# Patient Record
Sex: Male | Born: 1980 | Race: White | Hispanic: No | Smoking: Former smoker
Health system: Southern US, Community
[De-identification: ages and names within clinical notes are randomized; demographics above are authoritative.]

## PROBLEM LIST (undated history)

## (undated) DIAGNOSIS — K219 Gastro-esophageal reflux disease without esophagitis: Secondary | ICD-10-CM

## (undated) DIAGNOSIS — G4730 Sleep apnea, unspecified: Secondary | ICD-10-CM

## (undated) DIAGNOSIS — I7774 Dissection of vertebral artery: Secondary | ICD-10-CM

## (undated) HISTORY — PX: CEREBRAL ANGIOGRAM: SHX1326

## (undated) HISTORY — PX: OTHER SURGICAL HISTORY: SHX169

---
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
## 2004-11-30 ENCOUNTER — Emergency Department (HOSPITAL_COMMUNITY): Admission: EM | Admit: 2004-11-30 | Discharge: 2004-11-30 | Payer: Self-pay | Admitting: *Deleted

## 2006-03-05 ENCOUNTER — Ambulatory Visit: Payer: Self-pay | Admitting: Pulmonary Disease

## 2007-07-25 ENCOUNTER — Emergency Department (HOSPITAL_COMMUNITY): Admission: EM | Admit: 2007-07-25 | Discharge: 2007-07-25 | Payer: Self-pay | Admitting: Emergency Medicine

## 2009-10-12 DIAGNOSIS — I7774 Dissection of vertebral artery: Secondary | ICD-10-CM

## 2009-10-12 HISTORY — DX: Dissection of vertebral artery: I77.74

## 2011-03-23 ENCOUNTER — Other Ambulatory Visit (HOSPITAL_COMMUNITY): Payer: Self-pay | Admitting: Family Medicine

## 2011-03-23 DIAGNOSIS — I771 Stricture of artery: Secondary | ICD-10-CM

## 2011-03-26 ENCOUNTER — Other Ambulatory Visit (HOSPITAL_COMMUNITY): Payer: Self-pay | Admitting: Interventional Radiology

## 2011-03-26 ENCOUNTER — Ambulatory Visit (HOSPITAL_COMMUNITY)
Admission: RE | Admit: 2011-03-26 | Discharge: 2011-03-26 | Disposition: A | Discharge status: Home / Self Care | Payer: BC Managed Care – PPO | Source: Ambulatory Visit | Attending: Family Medicine | Admitting: Family Medicine

## 2011-03-26 DIAGNOSIS — N320 Bladder-neck obstruction: Secondary | ICD-10-CM

## 2011-03-26 DIAGNOSIS — I771 Stricture of artery: Secondary | ICD-10-CM

## 2011-03-27 ENCOUNTER — Other Ambulatory Visit (HOSPITAL_COMMUNITY): Payer: Self-pay | Admitting: Interventional Radiology

## 2011-03-27 ENCOUNTER — Ambulatory Visit (HOSPITAL_COMMUNITY)
Admission: RE | Admit: 2011-03-27 | Discharge: 2011-03-27 | Disposition: A | Discharge status: Home / Self Care | Payer: BC Managed Care – PPO | Source: Ambulatory Visit | Attending: Interventional Radiology | Admitting: Interventional Radiology

## 2011-03-27 DIAGNOSIS — R9409 Abnormal results of other function studies of central nervous system: Secondary | ICD-10-CM | POA: Insufficient documentation

## 2011-03-27 DIAGNOSIS — Z01812 Encounter for preprocedural laboratory examination: Secondary | ICD-10-CM | POA: Insufficient documentation

## 2011-03-27 DIAGNOSIS — N320 Bladder-neck obstruction: Secondary | ICD-10-CM

## 2011-03-27 DIAGNOSIS — R51 Headache: Secondary | ICD-10-CM | POA: Insufficient documentation

## 2011-03-27 LAB — POCT I-STAT, CHEM 8
BUN: 23 mg/dL (ref 6–23)
Chloride: 102 mEq/L (ref 96–112)
Creatinine, Ser: 1.2 mg/dL (ref 0.50–1.35)
Hemoglobin: 14.3 g/dL (ref 13.0–17.0)
TCO2: 28 mmol/L (ref 0–100)

## 2011-03-27 LAB — CBC
HCT: 40.1 % (ref 39.0–52.0)
MCV: 83.4 fL (ref 78.0–100.0)
RBC: 4.81 MIL/uL (ref 4.22–5.81)

## 2011-03-27 LAB — PROTIME-INR
INR: 1.02 (ref 0.00–1.49)
Prothrombin Time: 13.6 seconds (ref 11.6–15.2)

## 2011-03-27 MED ORDER — IOHEXOL 300 MG/ML  SOLN
150.0000 mL | Freq: Once | INTRAMUSCULAR | Status: AC | PRN
  Administered 2011-03-27: 80 mL via INTRAVENOUS

## 2011-03-30 ENCOUNTER — Other Ambulatory Visit (HOSPITAL_COMMUNITY): Payer: Self-pay | Admitting: Interventional Radiology

## 2011-03-30 DIAGNOSIS — I670 Dissection of cerebral arteries, nonruptured: Secondary | ICD-10-CM

## 2011-05-11 ENCOUNTER — Ambulatory Visit (HOSPITAL_COMMUNITY)
Admission: RE | Admit: 2011-05-11 | Discharge: 2011-05-11 | Disposition: A | Discharge status: Home / Self Care | Payer: BC Managed Care – PPO | Source: Ambulatory Visit | Attending: Interventional Radiology | Admitting: Interventional Radiology

## 2011-05-11 DIAGNOSIS — I670 Dissection of cerebral arteries, nonruptured: Secondary | ICD-10-CM

## 2012-06-26 ENCOUNTER — Emergency Department (HOSPITAL_COMMUNITY): Payer: BC Managed Care – PPO

## 2012-06-26 ENCOUNTER — Encounter (HOSPITAL_COMMUNITY): Payer: Self-pay | Admitting: Emergency Medicine

## 2012-06-26 ENCOUNTER — Emergency Department (HOSPITAL_COMMUNITY)
Admission: EM | Admit: 2012-06-26 | Discharge: 2012-06-26 | Disposition: A | Discharge status: Home / Self Care | Payer: BC Managed Care – PPO | Attending: Emergency Medicine | Admitting: Emergency Medicine

## 2012-06-26 DIAGNOSIS — M542 Cervicalgia: Secondary | ICD-10-CM | POA: Insufficient documentation

## 2012-06-26 DIAGNOSIS — Y92410 Unspecified street and highway as the place of occurrence of the external cause: Secondary | ICD-10-CM | POA: Insufficient documentation

## 2012-06-26 DIAGNOSIS — F1010 Alcohol abuse, uncomplicated: Secondary | ICD-10-CM | POA: Insufficient documentation

## 2012-06-26 DIAGNOSIS — R51 Headache: Secondary | ICD-10-CM | POA: Insufficient documentation

## 2012-06-26 DIAGNOSIS — R079 Chest pain, unspecified: Secondary | ICD-10-CM | POA: Insufficient documentation

## 2012-06-26 DIAGNOSIS — F10929 Alcohol use, unspecified with intoxication, unspecified: Secondary | ICD-10-CM

## 2012-06-26 DIAGNOSIS — R52 Pain, unspecified: Secondary | ICD-10-CM | POA: Insufficient documentation

## 2012-06-26 LAB — POCT I-STAT, CHEM 8
BUN: 6 mg/dL (ref 6–23)
Calcium, Ion: 1.11 mmol/L — ABNORMAL LOW (ref 1.12–1.23)
Chloride: 107 mEq/L (ref 96–112)
Potassium: 3.6 mEq/L (ref 3.5–5.1)
Sodium: 146 mEq/L — ABNORMAL HIGH (ref 135–145)

## 2012-06-26 LAB — CBC WITH DIFFERENTIAL/PLATELET
Basophils Absolute: 0 10*3/uL (ref 0.0–0.1)
Basophils Relative: 0 % (ref 0–1)
Eosinophils Absolute: 0.1 10*3/uL (ref 0.0–0.7)
Eosinophils Relative: 1 % (ref 0–5)
Hemoglobin: 14.5 g/dL (ref 13.0–17.0)
MCH: 31.9 pg (ref 26.0–34.0)
MCV: 89.5 fL (ref 78.0–100.0)
Monocytes Absolute: 0.3 10*3/uL (ref 0.1–1.0)
RBC: 4.55 MIL/uL (ref 4.22–5.81)

## 2012-06-26 LAB — ETHANOL: Alcohol, Ethyl (B): 255 mg/dL — ABNORMAL HIGH (ref 0–11)

## 2012-06-26 MED ORDER — SODIUM CHLORIDE 0.9 % IV BOLUS (SEPSIS)
1000.0000 mL | Freq: Once | INTRAVENOUS | Status: AC
  Administered 2012-06-26: 1000 mL via INTRAVENOUS

## 2012-06-26 MED ORDER — FENTANYL CITRATE 0.05 MG/ML IJ SOLN
50.0000 ug | Freq: Once | INTRAMUSCULAR | Status: AC
  Administered 2012-06-26: 50 ug via INTRAVENOUS
  Filled 2012-06-26: qty 2

## 2012-06-26 MED ORDER — ONDANSETRON HCL 4 MG/2ML IJ SOLN
4.0000 mg | Freq: Once | INTRAMUSCULAR | Status: AC
  Administered 2012-06-26: 4 mg via INTRAVENOUS
  Filled 2012-06-26: qty 2

## 2012-06-26 MED ORDER — ACETAMINOPHEN 325 MG PO TABS: 325.0000 mg | ORAL_TABLET | Freq: Four times a day (QID) | ORAL | Status: AC | PRN

## 2012-06-26 MED ORDER — LORAZEPAM 2 MG/ML IJ SOLN
INTRAMUSCULAR | Status: AC
  Administered 2012-06-26: 2 mg
  Filled 2012-06-26: qty 1

## 2012-06-26 MED ORDER — IOHEXOL 300 MG/ML  SOLN
100.0000 mL | Freq: Once | INTRAMUSCULAR | Status: AC | PRN
  Administered 2012-06-26: 100 mL via INTRAVENOUS

## 2012-06-26 NOTE — Progress Notes (Signed)
Orthopedic Tech Progress Note Patient Details:  Carlos Schwartz 02/23/1981 [id number]  Patient ID: Carlos Schwartz, male   DOB: [birth year], 31 y.o.   MRN: [id number]  Made trauma visit Nikki Dom 06/26/2012, 7:45 PM
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
## 2012-06-26 NOTE — ED Notes (Signed)
Error in charting.Level 2 activated 1922hrs

## 2012-06-26 NOTE — ED Notes (Signed)
Pt gave permission to chaplin Selinda Michaels to call his ex wife Crystal [phone].
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
## 2012-06-26 NOTE — Progress Notes (Deleted)
At patients request I called his ex-wife Chrystal. Also I had a brief conversation with him.  Selinda Michaels [phone]
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
## 2012-06-27 NOTE — Progress Notes (Signed)
Chaplain went into room and met patient, who was obviously in distress.  Brother and mother were present and trying to offer spiritual encouragement to patient.  A few minutes later, I endountered mother in hallway outside the room.  We talked for a few moments and I offered spiritual encouragement.  She asked for payers for her son, although she did not believe he would welcome them in person.  I assured her that I would pray with him. Further spiritual encouragement for mother, then RN came to talk with them and mother went back into the room  Rev. Audie Box [phone]

## 2012-06-27 NOTE — ED Provider Notes (Signed)
I saw and evaluated the patient, reviewed the resident's note and I agree with the findings and plan.  Deniah Saia T Shari Natt, MD 06/27/12 0925 

## 2012-12-18 ENCOUNTER — Ambulatory Visit (HOSPITAL_BASED_OUTPATIENT_CLINIC_OR_DEPARTMENT_OTHER): Payer: BC Managed Care – PPO
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
## 2014-10-16 ENCOUNTER — Ambulatory Visit (HOSPITAL_BASED_OUTPATIENT_CLINIC_OR_DEPARTMENT_OTHER): Payer: No Typology Code available for payment source | Attending: Family Medicine | Admitting: Radiology

## 2014-10-16 VITALS — Ht 74.0 in | Wt 230.0 lb

## 2014-10-16 DIAGNOSIS — G4733 Obstructive sleep apnea (adult) (pediatric): Secondary | ICD-10-CM | POA: Insufficient documentation

## 2014-10-16 DIAGNOSIS — G4710 Hypersomnia, unspecified: Secondary | ICD-10-CM | POA: Diagnosis present

## 2014-10-16 DIAGNOSIS — R0683 Snoring: Secondary | ICD-10-CM | POA: Diagnosis not present

## 2014-10-20 DIAGNOSIS — R0683 Snoring: Secondary | ICD-10-CM

## 2014-10-20 NOTE — Sleep Study (Signed)
   NAME: Carlos DoorMichael J Garrow DATE OF BIRTH:  08/19/1981 MEDICAL RECORD NUMBER [id number]  LOCATION: [facility]  PHYSICIAN: Nazaiah Navarrete D  DATE OF STUDY: 10/16/2014  SLEEP STUDY TYPE: Nocturnal Polysomnogram               REFERRING PHYSICIAN: Hollace KinnierKlein, Carol, MD  INDICATION FOR STUDY: Hypersomnia with sleep apnea  EPWORTH SLEEPINESS SCORE:   15/24 HEIGHT: 6\' 2"  (188 cm)  WEIGHT: 104.327 kg (230 lb)    Body mass index is 29.52 kg/(m^2).  NECK SIZE: 17.5 in.  MEDICATIONS: Charted for review  SLEEP ARCHITECTURE: Total sleep time 381.5 minutes with sleep efficiency 93.6%. Stage I was 11.5%, stage II 64.9%, stage III absent, REM 23.6% of total sleep time. Sleep latency 11 minutes, REM latency 144.5 minutes, awake after sleep onset 15 minutes, arousal index 21.1, bedtime medication: Ambien  RESPIRATORY DATA: Apnea hypopnea index (AHI) 37.1 per hour. 236 total events scored including 54 obstructive apneas, 4 central apneas, 1 mixed apnea, 177 hypopneas. Most events were while supine. REM AHI 7.3 per hour. This study was ordered as a diagnostic polysomnogram without CPAP.  OXYGEN DATA: Snoring was very loud while supine, moderate while on sides with oxygen desaturation to a nadir of 83% and mean saturation 94% on room air.  CARDIAC DATA: Sinus rhythm with PACs  MOVEMENT/PARASOMNIA: No significant movement disorder, no bathroom trips  IMPRESSION/ RECOMMENDATION:   1) Severe obstructive sleep apnea/hypopnea syndrome, AHI 37.1 per hour with mostly supine events. REM AHI 7.3 per hour. Snoring was very loud while supine, moderate when he was on his sides, with oxygen desaturation to a nadir of 83% and mean saturation 94% on room air. 2) Treatment for scores in this range usually starts with CPAP. If appropriate this patient can be scheduled for CPAP titration study through the sleep disorder center.   Carlos BudgeYOUNG,Carlos Schwartz D Diplomate, American Board of Sleep  Medicine  ELECTRONICALLY SIGNED ON:  10/20/2014, 2:38 PM [facility] PH: [phone]   FX: [phone] ACCREDITED BY THE AMERICAN ACADEMY OF SLEEP MEDICINE

## 2014-12-16 ENCOUNTER — Ambulatory Visit (HOSPITAL_BASED_OUTPATIENT_CLINIC_OR_DEPARTMENT_OTHER): Payer: No Typology Code available for payment source | Attending: Family Medicine

## 2014-12-16 VITALS — Ht 74.0 in | Wt 235.0 lb

## 2014-12-16 DIAGNOSIS — G4733 Obstructive sleep apnea (adult) (pediatric): Secondary | ICD-10-CM | POA: Insufficient documentation

## 2014-12-19 ENCOUNTER — Encounter (HOSPITAL_BASED_OUTPATIENT_CLINIC_OR_DEPARTMENT_OTHER): Payer: BC Managed Care – PPO

## 2014-12-22 DIAGNOSIS — G4733 Obstructive sleep apnea (adult) (pediatric): Secondary | ICD-10-CM

## 2014-12-22 NOTE — Sleep Study (Signed)
   NAME: Carlos Schwartz DATE OF BIRTH:  10/21/1980 MEDICAL RECORD NUMBER [id number]  LOCATION: [facility]  PHYSICIAN: YOUNG,CLINTON D  DATE OF STUDY: 12/16/2014  SLEEP STUDY TYPE: Nocturnal Polysomnogram               REFERRING PHYSICIAN: Donaciano EvaWoodruff, Sarah, FNP  INDICATION FOR STUDY: Hypersomnia with sleep apnea-CPAP titration  EPWORTH SLEEPINESS SCORE:   13/24 HEIGHT: 6\' 2"  (188 cm)  WEIGHT: 235 lb (106.595 kg)    Body mass index is 30.16 kg/(m^2).  NECK SIZE: 17.5 in.  MEDICATIONS: Charted for review  SLEEP ARCHITECTURE: Total sleep time 350 minutes with sleep efficiency 94.2%. Stage I was 4%, stage II 71.7%, stage III absent, REM 24.3% of total sleep time. Sleep latency 0 minutes, REM latency 62 minutes, awake after sleep onset 21.5 minutes, arousal index 6.0, bedtime medication: Ambien 10 mg, alprazolam 1 mg  RESPIRATORY DATA: CPAP titration protocol. CPAP was titrated to 10 CWP, AHI 0 per hour. He wore a medium fullface mask.  OXYGEN DATA: Snoring was prevented by CPAP with mean oxygen saturation 94.3% on room air  CARDIAC DATA: Normal sinus rhythm  MOVEMENT/PARASOMNIA: 405 limb jerks were counted of which only 8 were associated with arousal or awakening for a periodic limb movement with arousal index of 1.4 per hour. No bathroom trips.  IMPRESSION/ RECOMMENDATION:   1) Successful CPAP titration to 10 CWP, AHI 0 per hour. He wore a medium F&P Simplus fullface mask with heated humidifier and an EPR of 1  2) Baseline polysomnogram on 10/16/2014 recorded AHI 37.1 per hour with body weight 230 pounds. 3) Very frequent limb jerks were noted on the current study, but with little associated sleep disturbance noted. If limb jerks sleep disorder problems persist in the home environment then specific intervention such as Requip or Mirapex might be considered. The patient had taken Ambien 10 mg plus alprazolam 1 mg at 2230 PM on the current study  night.  Waymon BudgeYOUNG,CLINTON D Diplomate, American Board of Sleep Medicine  ELECTRONICALLY SIGNED ON:  12/22/2014, 9:59 AM [facility] PH: [phone]   FX: [phone] ACCREDITED BY THE AMERICAN ACADEMY OF SLEEP MEDICINE

## 2015-09-29 ENCOUNTER — Emergency Department (HOSPITAL_COMMUNITY)
Admission: EM | Admit: 2015-09-29 | Discharge: 2015-09-29 | Disposition: A | Discharge status: Home / Self Care | Payer: No Typology Code available for payment source | Attending: Emergency Medicine | Admitting: Emergency Medicine

## 2015-09-29 ENCOUNTER — Encounter (HOSPITAL_COMMUNITY): Payer: Self-pay | Admitting: Emergency Medicine

## 2015-09-29 DIAGNOSIS — K002 Abnormalities of size and form of teeth: Secondary | ICD-10-CM | POA: Insufficient documentation

## 2015-09-29 DIAGNOSIS — Z79899 Other long term (current) drug therapy: Secondary | ICD-10-CM | POA: Insufficient documentation

## 2015-09-29 DIAGNOSIS — K029 Dental caries, unspecified: Secondary | ICD-10-CM | POA: Insufficient documentation

## 2015-09-29 DIAGNOSIS — Z87891 Personal history of nicotine dependence: Secondary | ICD-10-CM | POA: Insufficient documentation

## 2015-09-29 DIAGNOSIS — K047 Periapical abscess without sinus: Secondary | ICD-10-CM | POA: Insufficient documentation

## 2015-09-29 MED ORDER — IBUPROFEN 600 MG PO TABS: 600.0000 mg | ORAL_TABLET | Freq: Three times a day (TID) | ORAL | Status: DC | PRN

## 2015-09-29 MED ORDER — HYDROCODONE-ACETAMINOPHEN 5-325 MG PO TABS: 1.0000 | ORAL_TABLET | ORAL | Status: DC | PRN

## 2015-09-29 MED ORDER — PENICILLIN V POTASSIUM 500 MG PO TABS
500.0000 mg | ORAL_TABLET | Freq: Four times a day (QID) | ORAL | Status: DC
Start: 2015-09-29 — End: 2016-12-14

## 2015-09-29 MED ORDER — IBUPROFEN 800 MG PO TABS
800.0000 mg | ORAL_TABLET | Freq: Once | ORAL | Status: AC
  Administered 2015-09-29: 800 mg via ORAL
  Filled 2015-09-29: qty 1

## 2015-09-29 NOTE — ED Provider Notes (Signed)
CSN: [id number]     Arrival date & time 09/29/15  1130 History   First MD Initiated Contact with Patient 09/29/15 1144     Chief Complaint  Patient presents with  . Dental Pain    pain in l/upper mouth x 1 day     (Consider location/radiation/quality/duration/timing/severity/associated sxs/prior Treatment) HPI   Patient presents with pain in his left lower molar with associated facial swelling.  States he has "bad teeth" and felt this tooth break a few days ago.  Yesterday he developed severe pain and facial swelling.  Took one goody powder this morning without relief.  Denies fevers, sore throat, difficulty swallowing or breathing.     History reviewed. No pertinent past medical history. Past Surgical History  Procedure Laterality Date  . Vad     History reviewed. No pertinent family history. Social History  Substance Use Topics  . Smoking status: Former Games developer  . Smokeless tobacco: None  . Alcohol Use: No    Review of Systems  Constitutional: Negative for fever and chills.  HENT: Positive for dental problem and facial swelling. Negative for sore throat and trouble swallowing.   Respiratory: Negative for shortness of breath and stridor.   Musculoskeletal: Negative for myalgias, neck pain and neck stiffness.  Skin: Negative for color change.  Allergic/Immunologic: Negative for immunocompromised state.  Psychiatric/Behavioral: Negative for self-injury.      Allergies  Review of patient's allergies indicates no known allergies.  Home Medications   Prior to Admission medications   Medication Sig Start Date End Date Taking? Authorizing Provider  ALPRAZolam Prudy Feeler) 1 MG tablet Take 1 mg by mouth at bedtime as needed. For anxiety    Historical Provider, MD  ARIPiprazole (ABILIFY) 20 MG tablet Take 20 mg by mouth daily.    Historical Provider, MD  ARIPiprazole (ABILIFY) 5 MG tablet Take 5 mg by mouth daily. Take with 20 mg to make 25 mg    Historical Provider, MD    HYDROcodone-acetaminophen (NORCO/VICODIN) 5-325 MG tablet Take 1-2 tablets by mouth every 4 (four) hours as needed for moderate pain or severe pain. 09/29/15   Trixie Dredge, PA-C  ibuprofen (ADVIL,MOTRIN) 600 MG tablet Take 1 tablet (600 mg total) by mouth every 8 (eight) hours as needed for mild pain or moderate pain. 09/29/15   Trixie Dredge, PA-C  penicillin v potassium (VEETID) 500 MG tablet Take 1 tablet (500 mg total) by mouth 4 (four) times daily. 09/29/15   Trixie Dredge, PA-C   BP 148/101 mmHg  Pulse 75  Temp(Src) 97.4 F (36.3 C) (Oral)  Resp 16  SpO2 100% Physical Exam  Constitutional: He appears well-developed and well-nourished. No distress.  HENT:  Head: Normocephalic and atraumatic.  Mouth/Throat: Uvula is midline and oropharynx is clear and moist. Mucous membranes are not dry. No trismus in the jaw. Abnormal dentition. Dental abscesses present. No uvula swelling. No oropharyngeal exudate, posterior oropharyngeal edema, posterior oropharyngeal erythema or tonsillar abscesses.    Eyes: Conjunctivae are normal.  Neck: Normal range of motion and phonation normal. Neck supple. No tracheal tenderness present. No rigidity. No tracheal deviation, no edema and no erythema present.  Cardiovascular: Normal rate.   Pulmonary/Chest: Effort normal and breath sounds normal. No stridor.  Lymphadenopathy:    He has no cervical adenopathy.  Neurological: He is alert.  Skin: He is not diaphoretic.  Nursing note and vitals reviewed.   ED Course  Procedures (including critical care time) Labs Review Labs Reviewed - No data to display  Imaging Review No results found. I have personally reviewed and evaluated these images and lab results as part of my medical decision-making.   EKG Interpretation None      MDM   Final diagnoses:  Dental abscess    Afebrile, nontoxic patient with new dental pain and obvious abscess associated with left lower second molar. No airway concern, no  trismus or abnormalities associated with anterior neck or submandibular space.  Doubt Ludwig's angina.  D/C home with antibiotic, pain medication and dental follow up.  Pt has dentist, does not need referral.  There is no oral surgeon on call today. Discussed findings, treatment, and follow up  with patient.  Pt given return precautions.  Pt verbalizes understanding and agrees with plan.         Trixie Dredgemily Alasha Mcguinness, PA-C 09/29/15 1215  Bethann BerkshireJoseph Zammit, MD 09/30/15 [phone]

## 2015-09-29 NOTE — Discharge Instructions (Signed)
Read the information below.  Use the prescribed medication as directed.  Please discuss all new medications with your pharmacist.  Do not take additional tylenol while taking the prescribed pain medication to avoid overdose.  You may return to the Emergency Department at any time for worsening condition or any new symptoms that concern you.  Please call the dentist listed above within 48 hours to schedule a close follow up appointment.  If you develop fevers, swelling in your neck, difficulty swallowing or breathing, return to the ER immediately for a recheck.     Dental Abscess A dental abscess is a collection of pus in or around a tooth. CAUSES This condition is caused by a bacterial infection around the root of the tooth that involves the inner part of the tooth (pulp). It may result from:  Severe tooth decay.  Trauma to the tooth that allows bacteria to enter into the pulp, such as a broken or chipped tooth.  Severe gum disease around a tooth. SYMPTOMS Symptoms of this condition include:  Severe pain in and around the infected tooth.  Swelling and redness around the infected tooth, in the mouth, or in the face.  Tenderness.  Pus drainage.  Bad breath.  Bitter taste in the mouth.  Difficulty swallowing.  Difficulty opening the mouth.  Nausea.  Vomiting.  Chills.  Swollen neck glands.  Fever. DIAGNOSIS This condition is diagnosed with examination of the infected tooth. During the exam, your dentist may tap on the infected tooth. Your dentist will also ask about your medical and dental history and may order X-rays. TREATMENT This condition is treated by eliminating the infection. This may be done with:  Antibiotic medicine.  A root canal. This may be performed to save the tooth.  Pulling (extracting) the tooth. This may also involve draining the abscess. This is done if the tooth cannot be saved. HOME CARE INSTRUCTIONS  Take medicines only as directed by your  dentist.  If you were prescribed antibiotic medicine, finish all of it even if you start to feel better.  Rinse your mouth (gargle) often with salt water to relieve pain or swelling.  Do not drive or operate heavy machinery while taking pain medicine.  Do not apply heat to the outside of your mouth.  Keep all follow-up visits as directed by your dentist. This is important. SEEK MEDICAL CARE IF:  Your pain is worse and is not helped by medicine. SEEK IMMEDIATE MEDICAL CARE IF:  You have a fever or chills.  Your symptoms suddenly get worse.  You have a very bad headache.  You have problems breathing or swallowing.  You have trouble opening your mouth.  You have swelling in your neck or around your eye.   This information is not intended to replace advice given to you by your health care provider. Make sure you discuss any questions you have with your health care provider.   Document Released: 09/28/2005 Document Revised: 02/12/2015 Document Reviewed: 09/25/2014 Elsevier Interactive Patient Education 2016 [employer]    Emergency Department Resource Guide 1) Find a Doctor and Pay Out of Pocket Although you won't have to find out who is covered by your insurance plan, it is a good idea to ask around and get recommendations. You will then need to call the office and see if the doctor you have chosen will accept you as a new patient and what types of options they offer for patients who are self-pay. Some doctors offer discounts or will  set up payment plans for their patients who do not have insurance, but you will need to ask so you aren't surprised when you get to your appointment.  2) Contact Your Local Health Department Not all health departments have doctors that can see patients for sick visits, but many do, so it is worth a call to see if yours does. If you don't know where your local health department is, you can check in your phone book. The CDC also has a tool to help  you locate your state's health department, and many state websites also have listings of all of their local health departments.  3) Find a Walk-in Clinic If your illness is not likely to be very severe or complicated, you may want to try a walk in clinic. These are popping up all over the country in pharmacies, drugstores, and shopping centers. They're usually staffed by nurse practitioners or physician assistants that have been trained to treat common illnesses and complaints. They're usually fairly quick and inexpensive. However, if you have serious medical issues or chronic medical problems, these are probably not your best option.  No Primary Care Doctor: - Call Health Connect at  [phone] - they can help you locate a primary care doctor that  accepts your insurance, provides certain services, etc. - Physician Referral Service- [phone]  Chronic Pain Problems: Organization         Address  Phone   Notes  [facility]  [phone] Patients need to be referred by their primary care doctor.   Medication Assistance: Organization         Address  Phone   Notes  [facility] Medication [facility] [address] [place] [place], [place] [address] [phone] --Must be a resident of [facility] -- Must have NO insurance coverage whatsoever (no Medicaid/ Medicare, etc.) -- The pt. MUST have a primary care doctor that directs their care regularly and follows them in the community   MedAssist  [phone]   [employer]  [phone]    Agencies that provide inexpensive medical care: Organization         Address  Phone   Notes  [facility]  [phone]   [facility]    [phone]   [facility] [address] [place], [place] [address] [phone]   [facility] [address] [place], [place] [phone]   Planned Parenthood    [phone]   [facility]    [phone]   [facility]  [address], [place] Phone:  [phone], Fax:  [phone] Hours of Operation:  9 am - 6 pm, M-F.  Also accepts Medicaid/Medicare and self-pay.  [facility]  [address], [place] Phone: [phone], Fax: [phone]. Hours of Operation:  8:30 am - 5:30 pm, M-F.  Also accepts Medicaid and self-pay.  [facility] [place] [address], [place] Phone: [phone]   [facility] [address] Natasha Bence [place], [place] [phone], Ext. [phone] Mondays & Thursdays: 7-9 AM.  First 15 patients are seen on a first come, first serve basis.    Medicaid-accepting [facility] Providers:  Organization         Address  Phone   Notes  [facility] [address], [place] [phone]  Also accepts self-pay patients.  [facility] [address] Laurell Josephs [place], [place]  [phone]   [facility] [address], [place] [phone]   [facility] [address], [place] [phone]   Renaye Rakers [address], [place]   [phone] Only accepts [place] patients after they have their name applied to their card.   Self-Pay (no insurance) in [facility]:  Organization         Address  Phone   Notes  Sickle Cell Patients, [facility] Internal Medicine [address] [place], [place] [phone]   [facility] [address] [place], [place] [phone]   [facility] [place]  [address], [place] [phone]   [facility]/Dr. Osei-Bonsu  [address], [place] or [address], [place] [phone] Phone number for both [place] and [place] locations is the same.  Urgent Medical and [facility] [address]  [place] [phone]   [facility] [address] [place] or [address] [phone]   [facility] [address], [place] [phone], phone; [phone], fax Sees patients 1st and 3rd Saturday of every month.  Must not qualify for public or private insurance (i.e. Medicaid, Medicare, [place] Health Choice, Veterans' Benefits)  Household income should be no more than 200% of the poverty level The clinic cannot treat you if you are pregnant or think you are pregnant  Sexually transmitted diseases are not treated at the clinic.    Dental Care: Organization         Address  Phone  Notes  [facility] Department of [facility] [address] [place], [place] [phone] Accepts children up to age 80 who are enrolled in [place] or [place] Health Choice; pregnant women with a Medicaid card; and children who have applied for Medicaid or [place] Health Choice, but were declined, whose parents can pay a reduced fee at time of service.  [facility] Department of [facility]  [address], [place] [phone] Accepts children up to age 95 who are enrolled in [place] or [place] Health Choice; pregnant women with a Medicaid card; and children who have applied for Medicaid or [place] Health Choice, but were declined, whose parents can pay a reduced fee at time of service.  [facility]  [address] [place], [place] [phone] Patients are seen by appointment only. Walk-ins are not accepted. [facility] will see patients 44 years of age and older. Monday - Tuesday (8am-5pm) Most Wednesdays (8:30-5pm) $30 per visit, cash only  [facility] Adult Dental Access [facility]  [address], [facility] [phone] Patients are seen by appointment only. Walk-ins are not accepted. [facility] will see patients 46 years of age and older. One Wednesday Evening  (Monthly: Volunteer Based).  $30 per visit, cash only  [employer]  [phone] for adults; Children under age 5, call Graduate Pediatric Dentistry at [phone]. Children aged 41-14, please call [phone] to request a pediatric application.  Dental services are provided in all areas of dental care including fillings, crowns and bridges, complete and partial dentures, implants, gum treatment, root canals, and extractions. Preventive care is also provided. Treatment  is provided to both adults and children. Patients are selected via a lottery and there is often a waiting list.   [facility] [address], [place]  [phone] [email or web address]   [facility] [address], [place], [place] [phone], Ext. [phone] Second and Fourth Thursday of each month, opens at 6:30 AM; Clinic ends at 9 AM.  Patients are seen on a first-come first-served basis, and a limited number are seen during each clinic.   [facility]  [address], [place], [place] [phone]   Eligibility Requirements You must have lived in [place], [place], or [place] counties for at least the last three months.   You cannot be eligible for state or federal sponsored [place] Cityhealthcare insurance, including CIGNAVeterans Administration, [place]icaid, or [employer]icare.   You generally cannot be eligible for healthcare insurance through your employer.    How to apply: Eligibility screenings are held every Tuesday and Wednesday afternoon from 1:00 pm until 4:00 pm. You do not need an appointment for the interview!  [facility] [address], [place], [place] [phone]   [facility]  [phone]   [facility]  [phone]   [facility] [place] [facility]  [phone]    Behavioral Health Resources in the Community: Intensive Outpatient Programs Organization         Address  Phone  Notes  [facility]  [facility] Services [address], [place], [place] [phone]   [facility] [address], [place], [place] [phone]   [facility] [address], [place], [place]  [phone]   [facility] [address],  [place], [place] [phone] or [phone]   Substance Abuse Resources Organization         Address  Phone  Notes  [facility]  [phone]   [facility]  [phone]   [facility]  [phone]   [facility]  [phone]   Residential & Outpatient Substance Abuse Program  [phone]   Psychological Services Organization         Address  Phone  Notes  [facility]  [phone]- [phone]   [facility]  [phone]- [phone]   [facility] [address], [place] [phone] or [phone]    Mobile Crisis Teams Organization         Address  Phone  Notes  [facility], Mobile Crisis Care Unit  [phone]   [facility]  [address] [place], [place] [phone]   Doristine LocksSharon DeEsch [address] [place] [phone]    Self-Help/Support Groups Organization         Address  Phone             Notes  [facility] [place] - variety of support groups  336- [phone] Call for more information  Narcotics Anonymous (NA), [facility] [address], [place]  2 meetings at this location   Statisticianesidential Treatment Programs Organization         Address  Phone  Notes  [facility] [address],    [place]  [phone]   [facility]  [address], [place] [phone], [place], [place] [phone]   [facility] [address], [place] [phone] Admissions: 8am-3pm M-F  [facility] [address]    [place], [place]  [phone]   [facility] [address] Starling Manns  [place], [place] [phone]   [facility] Turner Baillie [address],  [place], [place] [phone]   [facility] - Intensive Outpatient [address] Laurell Josephs [address], [place], [place] [phone]   [facility] Jihad Brownlow ([facility] [address]  [place], [place] [phone] or [phone]   [facility] ([facility]) [address] [place], [place] [phone] Accepts Medicaid  [facility] [address]  [place] [phone] Substance Abuse/Addiction Treatment   [facility] Organization         Address  Phone  Notes  [facility]  [phone]   Angie Fava, PhD [address] Ervin Knack [place], [place]   [phone] or [phone]   [facility]   [address] [place], [place] [phone]   [facility] [address], [place], [place] [phone] Insurance/Medicaid/sponsorship through [facility] [address]                                    [place], [place] [phone] Therapy/tele-psych/case  [facility] [address] [place], [place] [phone]    Dr. Lolly Mustache  [phone]   [facility] [place]  [employer] [facility] Dept. 1) [address]  2) [address] [place] 3)  [address], [place] [phone]  [phone]   [facility] Child Abuse Hotline [phone] or [phone] (After Hours)

## 2015-09-29 NOTE — ED Notes (Signed)
Pt reports pain and swelling in l/upper side of mouth. Pt reports a broken tooth in l/upper jaw

## 2016-10-01 ENCOUNTER — Encounter: Payer: Self-pay | Admitting: Emergency Medicine

## 2016-10-01 ENCOUNTER — Emergency Department: Payer: No Typology Code available for payment source

## 2016-10-01 ENCOUNTER — Emergency Department
Admission: EM | Admit: 2016-10-01 | Discharge: 2016-10-01 | Disposition: A | Discharge status: Home / Self Care | Payer: No Typology Code available for payment source | Attending: Emergency Medicine | Admitting: Emergency Medicine

## 2016-10-01 DIAGNOSIS — Z791 Long term (current) use of non-steroidal anti-inflammatories (NSAID): Secondary | ICD-10-CM | POA: Diagnosis not present

## 2016-10-01 DIAGNOSIS — S29012A Strain of muscle and tendon of back wall of thorax, initial encounter: Secondary | ICD-10-CM | POA: Diagnosis not present

## 2016-10-01 DIAGNOSIS — Z87891 Personal history of nicotine dependence: Secondary | ICD-10-CM | POA: Insufficient documentation

## 2016-10-01 DIAGNOSIS — Z79899 Other long term (current) drug therapy: Secondary | ICD-10-CM | POA: Diagnosis not present

## 2016-10-01 DIAGNOSIS — Y92410 Unspecified street and highway as the place of occurrence of the external cause: Secondary | ICD-10-CM | POA: Insufficient documentation

## 2016-10-01 DIAGNOSIS — S29019A Strain of muscle and tendon of unspecified wall of thorax, initial encounter: Secondary | ICD-10-CM

## 2016-10-01 DIAGNOSIS — Y999 Unspecified external cause status: Secondary | ICD-10-CM | POA: Diagnosis not present

## 2016-10-01 DIAGNOSIS — S60222A Contusion of left hand, initial encounter: Secondary | ICD-10-CM | POA: Insufficient documentation

## 2016-10-01 DIAGNOSIS — Y9389 Activity, other specified: Secondary | ICD-10-CM | POA: Insufficient documentation

## 2016-10-01 DIAGNOSIS — S5012XA Contusion of left forearm, initial encounter: Secondary | ICD-10-CM

## 2016-10-01 DIAGNOSIS — S299XXA Unspecified injury of thorax, initial encounter: Secondary | ICD-10-CM | POA: Diagnosis present

## 2016-10-01 LAB — CBC WITH DIFFERENTIAL/PLATELET
BASOS PCT: 1 %
Basophils Absolute: 0 10*3/uL (ref 0–0.1)
EOS ABS: 0.1 10*3/uL (ref 0–0.7)
Eosinophils Relative: 1 %
HCT: 43.7 % (ref 40.0–52.0)
HEMOGLOBIN: 15.4 g/dL (ref 13.0–18.0)
LYMPHS ABS: 2.6 10*3/uL (ref 1.0–3.6)
Lymphocytes Relative: 35 %
MCH: 31 pg (ref 26.0–34.0)
MCHC: 35.1 g/dL (ref 32.0–36.0)
MCV: 88.3 fL (ref 80.0–100.0)
Monocytes Absolute: 0.4 10*3/uL (ref 0.2–1.0)
Monocytes Relative: 6 %
NEUTROS PCT: 57 %
Neutro Abs: 4.2 10*3/uL (ref 1.4–6.5)
Platelets: 221 10*3/uL (ref 150–440)
RBC: 4.95 MIL/uL (ref 4.40–5.90)
RDW: 15.2 % — ABNORMAL HIGH (ref 11.5–14.5)
WBC: 7.4 10*3/uL (ref 3.8–10.6)

## 2016-10-01 LAB — URINALYSIS, COMPLETE (UACMP) WITH MICROSCOPIC
BACTERIA UA: NONE SEEN
BILIRUBIN URINE: NEGATIVE
Glucose, UA: NEGATIVE mg/dL
Hgb urine dipstick: NEGATIVE
KETONES UR: NEGATIVE mg/dL
LEUKOCYTES UA: NEGATIVE
Nitrite: NEGATIVE
Protein, ur: NEGATIVE mg/dL
SPECIFIC GRAVITY, URINE: 1.023 (ref 1.005–1.030)
SQUAMOUS EPITHELIAL / LPF: NONE SEEN
pH: 6 (ref 5.0–8.0)

## 2016-10-01 LAB — BASIC METABOLIC PANEL
Anion gap: 8 (ref 5–15)
BUN: 19 mg/dL (ref 6–20)
CHLORIDE: 103 mmol/L (ref 101–111)
CO2: 27 mmol/L (ref 22–32)
Calcium: 9.1 mg/dL (ref 8.9–10.3)
Creatinine, Ser: 1.17 mg/dL (ref 0.61–1.24)
GFR calc non Af Amer: >60 mL/min (ref >60)
Glucose, Bld: 87 mg/dL (ref 65–99)
POTASSIUM: 4.1 mmol/L (ref 3.5–5.1)
SODIUM: 138 mmol/L (ref 135–145)

## 2016-10-01 MED ORDER — DIAZEPAM 2 MG PO TABS: 2.0000 mg | ORAL_TABLET | Freq: Three times a day (TID) | ORAL | 0 refills | Status: DC | PRN

## 2016-10-01 MED ORDER — HYDROCODONE-ACETAMINOPHEN 5-325 MG PO TABS: 1.0000 | ORAL_TABLET | ORAL | 0 refills | Status: DC | PRN

## 2016-10-01 MED ORDER — NAPROXEN 500 MG PO TABS: 500.0000 mg | ORAL_TABLET | Freq: Two times a day (BID) | ORAL | 0 refills | Status: AC

## 2016-10-01 NOTE — Discharge Instructions (Signed)
Follow-up with your primary care doctor if any continued problems. Use ice or heat to muscle areas as needed for comfort. Take medication only as directed. Return to the emergency room over the holidays if any severe worsening of her symptoms.
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
## 2016-10-13 ENCOUNTER — Ambulatory Visit
Admission: RE | Admit: 2016-10-13 | Discharge: 2016-10-13 | Disposition: A | Discharge status: Home / Self Care | Payer: Managed Care, Other (non HMO) | Source: Ambulatory Visit | Attending: Family | Admitting: Family

## 2016-10-13 ENCOUNTER — Other Ambulatory Visit: Payer: Self-pay | Admitting: Family

## 2016-10-13 DIAGNOSIS — M5136 Other intervertebral disc degeneration, lumbar region: Secondary | ICD-10-CM | POA: Diagnosis not present

## 2016-10-13 DIAGNOSIS — M545 Low back pain: Secondary | ICD-10-CM | POA: Diagnosis present

## 2016-10-22 ENCOUNTER — Other Ambulatory Visit: Payer: Self-pay | Admitting: Family

## 2016-10-22 DIAGNOSIS — R1084 Generalized abdominal pain: Secondary | ICD-10-CM

## 2016-10-26 ENCOUNTER — Other Ambulatory Visit: Payer: Self-pay | Admitting: Family

## 2016-10-26 DIAGNOSIS — R109 Unspecified abdominal pain: Secondary | ICD-10-CM

## 2016-10-27 ENCOUNTER — Other Ambulatory Visit: Payer: Self-pay | Admitting: Family

## 2016-10-27 ENCOUNTER — Ambulatory Visit
Admission: RE | Admit: 2016-10-27 | Discharge: 2016-10-27 | Disposition: A | Discharge status: Home / Self Care | Payer: Managed Care, Other (non HMO) | Source: Ambulatory Visit | Attending: Family | Admitting: Family

## 2016-10-27 DIAGNOSIS — K439 Ventral hernia without obstruction or gangrene: Secondary | ICD-10-CM | POA: Diagnosis not present

## 2016-10-27 DIAGNOSIS — R109 Unspecified abdominal pain: Secondary | ICD-10-CM

## 2016-10-27 DIAGNOSIS — R1033 Periumbilical pain: Secondary | ICD-10-CM

## 2016-11-03 ENCOUNTER — Telehealth: Payer: Self-pay | Admitting: Family

## 2016-11-04 ENCOUNTER — Ambulatory Visit
Admission: RE | Admit: 2016-11-04 | Discharge: 2016-11-04 | Disposition: A | Discharge status: Home / Self Care | Payer: Managed Care, Other (non HMO) | Source: Ambulatory Visit | Attending: Family | Admitting: Family

## 2016-11-25 ENCOUNTER — Ambulatory Visit
Admission: RE | Admit: 2016-11-25 | Discharge: 2016-11-25 | Disposition: A | Discharge status: Home / Self Care | Payer: Managed Care, Other (non HMO) | Source: Ambulatory Visit | Attending: Family | Admitting: Family

## 2016-11-25 DIAGNOSIS — R1033 Periumbilical pain: Secondary | ICD-10-CM | POA: Insufficient documentation

## 2016-11-25 DIAGNOSIS — K439 Ventral hernia without obstruction or gangrene: Secondary | ICD-10-CM | POA: Insufficient documentation

## 2016-11-25 MED ORDER — IOPAMIDOL (ISOVUE-370) INJECTION 76%
100.0000 mL | Freq: Once | INTRAVENOUS | Status: AC | PRN
  Administered 2016-11-25: 100 mL via INTRAVENOUS

## 2016-12-17 ENCOUNTER — Encounter
Admission: RE | Admit: 2016-12-17 | Discharge: 2016-12-17 | Disposition: A | Discharge status: Home / Self Care | Payer: 59 | Source: Ambulatory Visit | Attending: Surgery | Admitting: Surgery

## 2016-12-17 HISTORY — DX: Sleep apnea, unspecified: G47.30

## 2016-12-17 HISTORY — DX: Gastro-esophageal reflux disease without esophagitis: K21.9

## 2016-12-17 HISTORY — DX: Dissection of vertebral artery: I77.74

## 2016-12-17 NOTE — Patient Instructions (Signed)
  Your procedure is scheduled on: 12-24-16 Report to Same Day Surgery 2nd floor medical mall [facility](Medical Mall Entrance-take elevator on left to 2nd floor.  Check in with surgery information desk.) To find out your arrival time please call [phone] between 1PM - 3PM on 3-14-A8  Remember: Instructions that are not followed completely may result in serious medical risk, up to and including death, or upon the discretion of your surgeon and anesthesiologist your surgery may need to be rescheduled.    _x___ 1. Do not eat food or drink liquids after midnight. No gum chewing or hard candies.     __x__ 2. No Alcohol for 24 hours before or after surgery.   __x__3. No Smoking for 24 prior to surgery.   ____  4. Bring all medications with you on the day of surgery if instructed.    __x__ 5. Notify your doctor if there is any change in your medical condition     (cold, fever, infections).     Do not wear jewelry, make-up, hairpins, clips or nail polish.  Do not wear lotions, powders, or perfumes. You may wear deodorant.  Do not shave 48 hours prior to surgery. Men may shave face and neck.  Do not bring valuables to the hospital.    [facility] not [facility] for any belongings or valuables.               Contacts, dentures or bridgework may not be worn into surgery.  Leave your suitcase in the car. After surgery it may be brought to your room.  For patients admitted to the hospital, discharge time is determined by your treatment team.   Patients discharged the day of surgery will not be allowed to drive home.  You will need someone to drive you home and stay with you the night of your procedure.    Please read over the following fact sheets that you were given:      _x___ Take anti-hypertensive (unless it includes a diuretic), cardiac, seizure, asthma,     anti-reflux and psychiatric medicines. These include:  1. PRILOSEC  2. TAKE A PRILOSEC ON Wednesday NIGHT BEFORE  BED  3.  4.  5.  6.  ____Fleets enema or Magnesium Citrate as directed.   ____ Use CHG Soap or sage wipes as directed on instruction sheet   ____ Use inhalers on the day of surgery and bring to hospital day of surgery  ____ Stop Metformin and Janumet 2 days prior to surgery.    ____ Take 1/2 of usual insulin dose the night before surgery and none on the morning     surgery.   ____ Follow recommendations from Cardiologist, Pulmonologist or PCP regarding stopping Aspirin, Coumadin, Pllavix ,Eliquis, Effient, or Pradaxa, and Pletal.  X____Stop Anti-inflammatories such as Advil, Aleve, Ibuprofen, Motrin, Naproxen, Naprosyn, Goodies powders or aspirin products NOW-OK to take Tylenol   ____ Stop supplements until after surgery.    ____ Bring C-Pap to the hospital.

## 2016-12-24 ENCOUNTER — Ambulatory Visit: Payer: 59 | Admitting: Certified Registered Nurse Anesthetist

## 2016-12-24 ENCOUNTER — Encounter
Admission: RE | Disposition: A | Discharge status: Home / Self Care | Payer: Self-pay | Source: Ambulatory Visit | Attending: Surgery

## 2016-12-24 ENCOUNTER — Encounter: Payer: Self-pay | Admitting: *Deleted

## 2016-12-24 ENCOUNTER — Ambulatory Visit
Admission: RE | Admit: 2016-12-24 | Discharge: 2016-12-24 | Disposition: A | Discharge status: Home / Self Care | Payer: 59 | Source: Ambulatory Visit | Attending: Surgery | Admitting: Surgery

## 2016-12-24 DIAGNOSIS — K436 Other and unspecified ventral hernia with obstruction, without gangrene: Secondary | ICD-10-CM | POA: Insufficient documentation

## 2016-12-24 DIAGNOSIS — K219 Gastro-esophageal reflux disease without esophagitis: Secondary | ICD-10-CM | POA: Insufficient documentation

## 2016-12-24 DIAGNOSIS — G4730 Sleep apnea, unspecified: Secondary | ICD-10-CM | POA: Diagnosis not present

## 2016-12-24 DIAGNOSIS — K439 Ventral hernia without obstruction or gangrene: Secondary | ICD-10-CM | POA: Diagnosis present

## 2016-12-24 HISTORY — PX: VENTRAL HERNIA REPAIR: SHX424

## 2016-12-24 LAB — URINE DRUG SCREEN, QUALITATIVE (ARMC ONLY)
Amphetamines, Ur Screen: NOT DETECTED
BENZODIAZEPINE, UR SCRN: NOT DETECTED
Barbiturates, Ur Screen: NOT DETECTED
CANNABINOID 50 NG, UR ~~LOC~~: POSITIVE — AB
Cocaine Metabolite,Ur ~~LOC~~: NOT DETECTED
MDMA (ECSTASY) UR SCREEN: NOT DETECTED
Methadone Scn, Ur: NOT DETECTED
Opiate, Ur Screen: NOT DETECTED
PHENCYCLIDINE (PCP) UR S: NOT DETECTED
Tricyclic, Ur Screen: POSITIVE — AB

## 2016-12-24 SURGERY — REPAIR, HERNIA, VENTRAL
Anesthesia: General | Wound class: Clean

## 2016-12-24 MED ORDER — FENTANYL CITRATE (PF) 100 MCG/2ML IJ SOLN: 25.0000 ug | INTRAMUSCULAR | Status: DC | PRN

## 2016-12-24 MED ORDER — ONDANSETRON HCL 4 MG/2ML IJ SOLN
INTRAMUSCULAR | Status: AC
  Filled 2016-12-24: qty 2

## 2016-12-24 MED ORDER — HYDROCODONE-ACETAMINOPHEN 5-325 MG PO TABS: 1.0000 | ORAL_TABLET | ORAL | Status: DC | PRN

## 2016-12-24 MED ORDER — SUGAMMADEX SODIUM 200 MG/2ML IV SOLN
INTRAVENOUS | Status: DC | PRN
  Administered 2016-12-24: 200 mg via INTRAVENOUS

## 2016-12-24 MED ORDER — ONDANSETRON HCL 4 MG/2ML IJ SOLN
INTRAMUSCULAR | Status: DC | PRN
  Administered 2016-12-24: 4 mg via INTRAVENOUS

## 2016-12-24 MED ORDER — FENTANYL CITRATE (PF) 100 MCG/2ML IJ SOLN
INTRAMUSCULAR | Status: AC
  Filled 2016-12-24: qty 2

## 2016-12-24 MED ORDER — LIDOCAINE HCL (CARDIAC) 20 MG/ML IV SOLN
INTRAVENOUS | Status: DC | PRN
  Administered 2016-12-24: 40 mg via INTRAVENOUS

## 2016-12-24 MED ORDER — LACTATED RINGERS IV SOLN
INTRAVENOUS | Status: DC
  Administered 2016-12-24: 07:00:00 via INTRAVENOUS

## 2016-12-24 MED ORDER — CEFAZOLIN SODIUM-DEXTROSE 2-4 GM/100ML-% IV SOLN
INTRAVENOUS | Status: AC
  Filled 2016-12-24: qty 100

## 2016-12-24 MED ORDER — GLYCOPYRROLATE 0.2 MG/ML IJ SOLN
INTRAMUSCULAR | Status: DC | PRN
  Administered 2016-12-24: .2 mg via INTRAVENOUS

## 2016-12-24 MED ORDER — FENTANYL CITRATE (PF) 100 MCG/2ML IJ SOLN
INTRAMUSCULAR | Status: DC | PRN
  Administered 2016-12-24: 100 ug via INTRAVENOUS

## 2016-12-24 MED ORDER — PROPOFOL 10 MG/ML IV BOLUS
INTRAVENOUS | Status: AC
  Filled 2016-12-24: qty 20

## 2016-12-24 MED ORDER — BUPIVACAINE-EPINEPHRINE (PF) 0.5% -1:200000 IJ SOLN
INTRAMUSCULAR | Status: AC
  Filled 2016-12-24: qty 30

## 2016-12-24 MED ORDER — DEXAMETHASONE SODIUM PHOSPHATE 10 MG/ML IJ SOLN
INTRAMUSCULAR | Status: DC | PRN
  Administered 2016-12-24: 10 mg via INTRAVENOUS

## 2016-12-24 MED ORDER — PROPOFOL 10 MG/ML IV BOLUS
INTRAVENOUS | Status: DC | PRN
  Administered 2016-12-24: 200 mg via INTRAVENOUS

## 2016-12-24 MED ORDER — LIDOCAINE HCL (PF) 2 % IJ SOLN
INTRAMUSCULAR | Status: AC
  Filled 2016-12-24: qty 2

## 2016-12-24 MED ORDER — MIDAZOLAM HCL 2 MG/2ML IJ SOLN
INTRAMUSCULAR | Status: AC
  Filled 2016-12-24: qty 2

## 2016-12-24 MED ORDER — PROMETHAZINE HCL 25 MG/ML IJ SOLN: 6.2500 mg | INTRAMUSCULAR | Status: DC | PRN

## 2016-12-24 MED ORDER — MIDAZOLAM HCL 2 MG/2ML IJ SOLN
INTRAMUSCULAR | Status: DC | PRN
  Administered 2016-12-24: 2 mg via INTRAVENOUS

## 2016-12-24 MED ORDER — ROCURONIUM BROMIDE 100 MG/10ML IV SOLN
INTRAVENOUS | Status: DC | PRN
  Administered 2016-12-24: 15 mg via INTRAVENOUS
  Administered 2016-12-24: 25 mg via INTRAVENOUS

## 2016-12-24 MED ORDER — DEXAMETHASONE SODIUM PHOSPHATE 10 MG/ML IJ SOLN
INTRAMUSCULAR | Status: AC
  Filled 2016-12-24: qty 1

## 2016-12-24 MED ORDER — SUCCINYLCHOLINE CHLORIDE 20 MG/ML IJ SOLN
INTRAMUSCULAR | Status: DC | PRN
Start: 2016-12-24 — End: 2016-12-24
  Administered 2016-12-24: 100 mg via INTRAVENOUS

## 2016-12-24 MED ORDER — HYDROCODONE-ACETAMINOPHEN 5-325 MG PO TABS: 1.0000 | ORAL_TABLET | ORAL | 0 refills | Status: AC | PRN

## 2016-12-24 MED ORDER — CEFAZOLIN SODIUM-DEXTROSE 2-4 GM/100ML-% IV SOLN
2.0000 g | Freq: Once | INTRAVENOUS | Status: AC
  Administered 2016-12-24: 2 g via INTRAVENOUS

## 2016-12-24 MED ORDER — BUPIVACAINE-EPINEPHRINE 0.5% -1:200000 IJ SOLN
INTRAMUSCULAR | Status: DC | PRN
  Administered 2016-12-24: 15 mL

## 2016-12-24 SURGICAL SUPPLY — 26 items
BLADE CLIPPER SURG (BLADE) ×3 IMPLANT
CANISTER SUCT 1200ML W/VALVE (MISCELLANEOUS) ×3 IMPLANT
CHLORAPREP W/TINT 26ML (MISCELLANEOUS) ×3 IMPLANT
DERMABOND ADVANCED (GAUZE/BANDAGES/DRESSINGS) ×2
DERMABOND ADVANCED .7 DNX12 (GAUZE/BANDAGES/DRESSINGS) ×1 IMPLANT
DRAPE LAPAROTOMY 100X77 ABD (DRAPES) ×3 IMPLANT
ELECT REM PT RETURN 9FT ADLT (ELECTROSURGICAL) ×3
ELECTRODE REM PT RTRN 9FT ADLT (ELECTROSURGICAL) ×1 IMPLANT
GAUZE SPONGE 4X4 12PLY STRL (GAUZE/BANDAGES/DRESSINGS) IMPLANT
GLOVE BIO SURGEON STRL SZ7.5 (GLOVE) ×18 IMPLANT
GOWN STRL REUS W/ TWL LRG LVL3 (GOWN DISPOSABLE) ×3 IMPLANT
GOWN STRL REUS W/TWL LRG LVL3 (GOWN DISPOSABLE) ×6
KIT RM TURNOVER STRD PROC AR (KITS) ×3 IMPLANT
LABEL OR SOLS (LABEL) ×3 IMPLANT
MESH SYNTHETIC 4X6 SOFT BARD (Mesh General) ×1 IMPLANT
MESH SYNTHETIC SOFT BARD 4X6 (Mesh General) ×2 IMPLANT
NEEDLE HYPO 25X1 1.5 SAFETY (NEEDLE) ×3 IMPLANT
NS IRRIG 500ML POUR BTL (IV SOLUTION) ×3 IMPLANT
PACK BASIN MINOR ARMC (MISCELLANEOUS) ×3 IMPLANT
STAPLER SKIN PROX 35W (STAPLE) IMPLANT
SUT CHROMIC 3 0 SH 27 (SUTURE) ×3 IMPLANT
SUT MNCRL 4-0 (SUTURE) ×2
SUT MNCRL 4-0 27XMFL (SUTURE) ×1
SUT SURGILON 0 30 BLK (SUTURE) ×6 IMPLANT
SUTURE MNCRL 4-0 27XMF (SUTURE) ×1 IMPLANT
SYRINGE 10CC LL (SYRINGE) ×3 IMPLANT

## 2016-12-24 NOTE — Anesthesia Post-op Follow-up Note (Cosign Needed)
Anesthesia QCDR form completed.        

## 2016-12-24 NOTE — Op Note (Signed)
OPERATIVE REPORT  PREOPERATIVE  DIAGNOSIS: . Ventral hernia  POSTOPERATIVE DIAGNOSIS: . Ventral hernia  PROCEDURE: . Ventral hernia repair  ANESTHESIA:  General  SURGEON: Renda RollsWilton Smith  MD   INDICATIONS: . He reported recent discomfort in the abdomen in the epigastric area just above the umbilicus. On physical exam a ventral hernia was demonstrated and repair was recommended for definitive treatment.  With the patient on the operating table in the supine position under general anesthesia the abdomen was prepared with ChloraPrep and draped in a sterile manner. An incision was made in the epigastrium oriented longitudinally so that the lower end of the incision was approximately 1 cm cephalad to the umbilicus. An incision was 5 cm in length. Dissection was carried down through subcutaneous tissues to encounter a ventral hernia consisting of herniated properitoneal fat. This fatty tissue 3 cm in dimension was dissected free from surrounding structures and identified the fascial ring defect. It was necessary to enlarge the fascial ring defect by about 5 mm on each side and subsequently the herniated properitoneal fat could be reduced back into the abdominal cavity. The properitoneal fat was dissected away from the fascia circumferentially around the defect. Bard soft mesh was cut to create an oval shape of 2.8 x 4 cm. This was placed into the properitoneal plane oriented longitudinally. The mesh was sutured to the overlying fascia with through and through 0 Surgilon sutures. Next the fascia was closed with a transversely oriented suture line of interrupted 0 Surgilon figure-of-eight sutures incorporating each suture into the mesh. The subcutaneous tissues and fascia were infiltrated with half percent Sensorcaine with epinephrine. It is noted that during the course of the procedure a number of small bleeding points were cauterized. Hemostasis was surgically intact. Blood loss was minimal. A 3-0 chromic  pursestring suture was placed to approximate fatty tissues. The skin was closed with running 4-0 Monocryl subcuticular suture and Dermabond.  The patient tolerated surgery satisfactorily and was then prepared for transfer to the recovery room  [facility]ilton Schwartz M.D.

## 2016-12-24 NOTE — Anesthesia Preprocedure Evaluation (Signed)
Anesthesia Evaluation  Patient identified by MRN, date of birth, ID band Patient awake    Reviewed: Allergy & Precautions, H&P , NPO status , Patient's Chart, lab work & pertinent test results, reviewed documented beta blocker date and time   History of Anesthesia Complications Negative for: history of anesthetic complications  Airway Mallampati: III  TM Distance: >3 FB Neck ROM: full    Dental  (+) Teeth Intact, Missing   Pulmonary neg shortness of breath, sleep apnea , neg COPD, neg recent URI, former smoker,           Cardiovascular Exercise Tolerance: Good negative cardio ROS       Neuro/Psych negative neurological ROS  negative psych ROS   GI/Hepatic Neg liver ROS, GERD  Medicated,  Endo/Other  negative endocrine ROS  Renal/GU negative Renal ROS  negative genitourinary   Musculoskeletal   Abdominal   Peds  Hematology negative hematology ROS (+)   Anesthesia Other Findings Past Medical History: No date: GERD (gastroesophageal reflux disease) No date: Sleep apnea     Comment: NO CPAP 2011: Vertebral artery dissection (HCC)     Comment: SEVERE HA WITH FINDINGS OF VAD-TREATED WITH               PLAVIX  X 1 YEAR-ALSO TOOK ASA FOR A FEW YEARS               BUT NO ASA OVER THE LAST YEAR   Reproductive/Obstetrics negative OB ROS                             Anesthesia Physical Anesthesia Plan  ASA: II  Anesthesia Plan: General   Post-op Pain Management:    Induction:   Airway Management Planned:   Additional Equipment:   Intra-op Plan:   Post-operative Plan:   Informed Consent: I have reviewed the patients History and Physical, chart, labs and discussed the procedure including the risks, benefits and alternatives for the proposed anesthesia with the patient or authorized representative who has indicated his/her understanding and acceptance.   Dental Advisory Given  Plan  Discussed with: Anesthesiologist, CRNA and Surgeon  Anesthesia Plan Comments:         Anesthesia Quick Evaluation

## 2016-12-24 NOTE — Anesthesia Postprocedure Evaluation (Signed)
Anesthesia Post Note  Patient: Nolon BussingMichael J Pautz  Procedure(s) Performed: Procedure(s) (LRB): HERNIA REPAIR VENTRAL ADULT (N/A)  Patient location during evaluation: PACU Anesthesia Type: General Level of consciousness: awake and alert Pain management: pain level controlled Vital Signs Assessment: post-procedure vital signs reviewed and stable Respiratory status: spontaneous breathing, nonlabored ventilation, respiratory function stable and patient connected to nasal cannula oxygen Cardiovascular status: blood pressure returned to baseline and stable Postop Assessment: no signs of nausea or vomiting Anesthetic complications: no     Last Vitals:  Vitals:   12/24/16 0931 12/24/16 1026  BP: 123/76 127/72  Pulse: 65 71  Resp: 16 16  Temp: 36.6 C     Last Pain:  Vitals:   12/24/16 1026  TempSrc:   PainSc: 4                  Lenard SimmerAndrew Treydon Henricks

## 2016-12-24 NOTE — Discharge Instructions (Addendum)
Take Tylenol or Norco if needed for pain.  Should not drive or do anything dangerous when taking Norco.  May shower.  Avoid straining and heavy lifting.   AMBULATORY SURGERY  DISCHARGE INSTRUCTIONS   1) The drugs that you were given will stay in your system until tomorrow so for the next 24 hours you should not:  A) Drive an automobile B) Make any legal decisions C) Drink any alcoholic beverage   2) You may resume regular meals tomorrow.  Today it is better to start with liquids and gradually work up to solid foods.  You may eat anything you prefer, but it is better to start with liquids, then soup and crackers, and gradually work up to solid foods.   3) Please notify your doctor immediately if you have any unusual bleeding, trouble breathing, redness and pain at the surgery site, drainage, fever, or pain not relieved by medication.   4) Additional Instructions:Splint your incision any time you cough, sneeze or move with a small towel or pillow.  Do not peel off the glue over incision, it will come off over time.

## 2016-12-24 NOTE — H&P (Signed)
  He reports no change in condition since office exam  Epigastric site of hernia marked with ink.  Discussed plan for ventral hernia repair.

## 2016-12-24 NOTE — OR Nursing (Signed)
Dr. Katrinka BlazingSmith into see pt and sister, work excuse given to pt.  OK to dc home.

## 2016-12-24 NOTE — Anesthesia Procedure Notes (Signed)
Procedure Name: Intubation Date/Time: 12/24/2016 7:39 AM Performed by: Derinda LateIACONE, Jahid Weida Pre-anesthesia Checklist: Patient identified, Patient being monitored, Timeout performed, Emergency Drugs available and Suction available Patient Re-evaluated:Patient Re-evaluated prior to inductionOxygen Delivery Method: Circle system utilized Preoxygenation: Pre-oxygenation with 100% oxygen Intubation Type: IV induction Ventilation: Mask ventilation without difficulty Laryngoscope Size: Miller and 2 Grade View: Grade II Tube type: Oral Tube size: 7.0 mm Number of attempts: 1 Airway Equipment and Method: Stylet Placement Confirmation: ETT inserted through vocal cords under direct vision,  positive ETCO2 and breath sounds checked- equal and bilateral Secured at: 21 cm Tube secured with: Tape Dental Injury: Teeth and Oropharynx as per pre-operative assessment  Comments: Cricoid pressure increased view of cord when epiglottis was lifts.

## 2016-12-24 NOTE — Transfer of Care (Signed)
Immediate Anesthesia Transfer of Care Note  Patient: Carlos DoorMichael J Barner  Procedure(s) Performed: Procedure(s): HERNIA REPAIR VENTRAL ADULT (N/A)  Patient Location: PACU  Anesthesia Type:General  Level of Consciousness: awake, alert  and oriented  Airway & Oxygen Therapy: Patient Spontanous Breathing and Patient connected to face mask oxygen  Post-op Assessment: Report given to RN, Post -op Vital signs reviewed and stable and Patient moving all extremities X 4  Post vital signs: Reviewed and stable  Last Vitals:  Vitals:   12/24/16 0633 12/24/16 0850  BP: 129/76 128/75  Pulse: (!) 58 80  Resp: 18 18  Temp: 36.2 C 36.5 C    Last Pain:  Vitals:   12/24/16 0633  TempSrc: Tympanic         Complications: No apparent anesthesia complications
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
## 2023-03-01 ENCOUNTER — Emergency Department
Admission: EM | Admit: 2023-03-01 | Discharge: 2023-03-01 | Disposition: A | Discharge status: Home / Self Care | Payer: No Typology Code available for payment source | Attending: Emergency Medicine | Admitting: Emergency Medicine

## 2023-03-01 ENCOUNTER — Emergency Department: Payer: No Typology Code available for payment source

## 2023-03-01 DIAGNOSIS — W11XXXA Fall on and from ladder, initial encounter: Secondary | ICD-10-CM | POA: Insufficient documentation

## 2023-03-01 DIAGNOSIS — M62838 Other muscle spasm: Secondary | ICD-10-CM | POA: Insufficient documentation

## 2023-03-01 DIAGNOSIS — M542 Cervicalgia: Secondary | ICD-10-CM | POA: Diagnosis present

## 2023-03-01 MED ORDER — DIAZEPAM 2 MG PO TABS: 2.0000 mg | ORAL_TABLET | Freq: Three times a day (TID) | ORAL | 0 refills | Status: AC | PRN

## 2023-03-01 MED ORDER — MELOXICAM 15 MG PO TABS: 15.0000 mg | ORAL_TABLET | Freq: Every day | ORAL | 1 refills | Status: AC

## 2023-03-01 MED ORDER — DIAZEPAM 2 MG PO TABS
2.0000 mg | ORAL_TABLET | Freq: Once | ORAL | Status: AC
  Administered 2023-03-01: 2 mg via ORAL
  Filled 2023-03-01: qty 1

## 2023-03-01 MED ORDER — KETOROLAC TROMETHAMINE 30 MG/ML IJ SOLN
30.0000 mg | Freq: Once | INTRAMUSCULAR | Status: AC
  Administered 2023-03-01: 30 mg via INTRAMUSCULAR
  Filled 2023-03-01: qty 1

## 2023-03-01 NOTE — ED Triage Notes (Addendum)
Pt sts that he was sanding some sheet rock when he started to get neck pain that went to his back. Pt sts that he has been having this pain x1 week. Pt sts that he went to UC and was prescribed muscle relaxers.

## 2023-03-01 NOTE — ED Provider Notes (Signed)
[facility] Provider Note  Patient Contact: 4:20 PM (approximate)   History   Back Pain   HPI  Carlos Schwartz is a 42 y.o. male presents to the emergency department after patient fell approximately 3 feet from a ladder while working on his house about a week ago.  He states that he had x-rays of [facility] left shoulder taken [facility] which showed no acute abnormality.  He has had some posterior left-sided neck pain that radiates to his elbow.  He feels as though his muscles are spasming.  No numbness or tingling.  No bowel or bladder incontinence or saddle anesthesia.      Physical Exam   Triage Vital Signs: ED Triage Vitals  Enc Vitals Group     BP 03/01/23 1340 (!) 145/88     Pulse Rate 03/01/23 1340 84     Resp 03/01/23 1340 19     Temp 03/01/23 1340 97.8 F (36.6 C)     Temp Source 03/01/23 1340 Oral     SpO2 03/01/23 1340 98 %     Weight 03/01/23 1341 (!) 320 lb (145.2 kg)     Height --      Head Circumference --      Peak Flow --      Pain Score 03/01/23 1341 8     Pain Loc --      Pain Edu? --      Excl. in GC? --     Most recent vital signs: Vitals:   03/01/23 1340  BP: (!) 145/88  Pulse: 84  Resp: 19  Temp: 97.8 F (36.6 C)  SpO2: 98%     General: Alert and in no acute distress. Eyes:  PERRL. EOMI. Head: No acute traumatic findings ENT:      Nose: No congestion/rhinnorhea.      Mouth/Throat: Mucous membranes are moist. Neck: No stridor. No cervical spine tenderness to palpation. Cardiovascular:  Good peripheral perfusion Respiratory: Normal respiratory effort without tachypnea or retractions. Lungs CTAB. Good air entry to the bases with no decreased or absent breath sounds. Gastrointestinal: Bowel sounds 4 quadrants. Soft and nontender to palpation. No guarding or rigidity. No palpable masses. No distention. No CVA tenderness. Musculoskeletal: Symmetric grip strength.  Full range of motion to all extremities.  Patient has  paraspinal muscle tenderness to palpation along the lateral aspect of the neck. Neurologic:  No gross focal neurologic deficits are appreciated.  Skin:   No rash noted    ED Results / Procedures / Treatments   Labs (all labs ordered are listed, but only abnormal results are displayed) Labs Reviewed - No data to display    RADIOLOGY  I personally viewed and evaluated these images as part of my medical decision making, as well as reviewing the written report by the radiologist.  ED Provider Interpretation: No acute abnormality on CT of the cervical spine.   PROCEDURES:  Critical Care performed: No  Procedures   MEDICATIONS ORDERED IN ED: Medications  diazepam (VALIUM) tablet 2 mg (2 mg Oral Given 03/01/23 1625)  ketorolac (TORADOL) 30 MG/ML injection 30 mg (30 mg Intramuscular Given 03/01/23 1625)     IMPRESSION / MDM / ASSESSMENT AND PLAN / ED COURSE  I reviewed the triage vital signs and the nursing notes.                              Assessment and plan: Fall:  Muscle spasm: 42 year old male presents to the emergency department after he fell approximately 3 feet from some scaffolding.  Vital signs were reassuring at triage.  On exam, patient was alert and nontoxic-appearing.  He states that he is already had x-rays of his left shoulder that were conducted at an urgent care in [place] which were reassuring without acute bony abnormality.  Patient had full range of motion at the left shoulder without any left rotator cuff weakness with testing.  He had full range of motion at the neck and CT of the cervical spine showed no evidence of C-spine fracture.  Do suspect the patient has muscle spasming.  Will give patient 2 mg of Valium while in the emergency department and 30 mg of Toradol and will reassess.  Patient's pain improved significantly after aforementioned medications were administered.  Patient was prescribed a short course of Valium for muscle spasms and daily  meloxicam.  Return precautions were given to return with new or worsening symptoms.  All patient questions were answered.     FINAL CLINICAL IMPRESSION(S) / ED DIAGNOSES   Final diagnoses:  Muscle spasm     Rx / DC Orders   ED Discharge Orders          Ordered    diazepam (VALIUM) 2 MG tablet  Every 8 hours PRN        03/01/23 1701    meloxicam (MOBIC) 15 MG tablet  Daily        03/01/23 1701             Note:  This document was prepared using Dragon voice recognition software and may include unintentional dictation errors.   Pia Mau [place], PA-C 03/01/23 1704    Concha Se, MD 03/03/23 [phone]

## 2023-03-01 NOTE — Discharge Instructions (Signed)
You can take Valium at night before bed. You can take 1 tablet of meloxicam daily for pain and inflammation.

## 2023-03-01 NOTE — ED Provider Triage Note (Signed)
Emergency Medicine Provider Triage Evaluation Note  Carlos Schwartz, a 42 y.o. male  was evaluated in triage.  Pt complains of neck pain with L UE paresthesias.  Patient presents after mechanical fall on Sunday where he was on a scaffold (2 ft high) sanding sheet rock on the ceiling.  He apparently fell backwards landing injuring his neck and upper back.  Since that time he has had neck pain with L UE paresthesias aggravated by range of motion.  Denies any head injury, LOC, or chest pain.  No bladder or bowel incontinence reported.  Review of Systems  Positive: Neck pain, LUE paresthesias Negative: CP, LOC  Physical Exam  BP (!) 145/88 (BP Location: Left Arm)   Pulse 84   Temp 97.8 F (36.6 C) (Oral)   Resp 19   Wt (!) 145.2 kg   SpO2 98%   BMI 41.09 kg/m  Gen:   Awake, no distress  NAD Resp:  Normal effort  MSK:   Moves extremities without difficulty  Other:    Medical Decision Making  Medically screening exam initiated at 1:44 PM.  Appropriate orders placed.  Carlos Schwartz was informed that the remainder of the evaluation will be completed by another provider, this initial triage assessment does not replace that evaluation, and the importance of remaining in the ED until their evaluation is complete.  Patient to the ED for evaluation of acute neck pain with LUE paresthesias following mechanical fall   Carlos Schwartz, Charlesetta Ivory, PA-C 03/01/23 1346
